# Patient Record
Sex: Female | Born: 2000 | Race: Black or African American | Hispanic: No | Marital: Single | Smoking: Never smoker
Health system: Southern US, Community
[De-identification: ages and names within clinical notes are randomized; demographics above are authoritative.]

---
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
## 2020-02-03 ENCOUNTER — Ambulatory Visit: Payer: 59 | Admitting: Internal Medicine

## 2020-08-28 ENCOUNTER — Other Ambulatory Visit: Payer: Self-pay

## 2020-08-28 ENCOUNTER — Encounter (HOSPITAL_COMMUNITY): Payer: Self-pay | Admitting: Emergency Medicine

## 2020-08-28 ENCOUNTER — Emergency Department (HOSPITAL_COMMUNITY): Payer: Self-pay

## 2020-08-28 ENCOUNTER — Emergency Department (HOSPITAL_COMMUNITY)
Admission: EM | Admit: 2020-08-28 | Discharge: 2020-08-28 | Disposition: A | Discharge status: Home / Self Care | Payer: Self-pay | Attending: Emergency Medicine | Admitting: Emergency Medicine

## 2020-08-28 DIAGNOSIS — W228XXA Striking against or struck by other objects, initial encounter: Secondary | ICD-10-CM | POA: Insufficient documentation

## 2020-08-28 DIAGNOSIS — S93401A Sprain of unspecified ligament of right ankle, initial encounter: Secondary | ICD-10-CM | POA: Insufficient documentation

## 2020-08-28 MED ORDER — IBUPROFEN 800 MG PO TABS
800.0000 mg | ORAL_TABLET | Freq: Once | ORAL | Status: AC
  Administered 2020-08-28: 800 mg via ORAL
  Filled 2020-08-28: qty 1

## 2020-08-28 NOTE — Discharge Instructions (Addendum)
We recommend bracing for stability as well as the use of ibuprofen every 6 hours for pain control.  Ice your ankle 3-4 times per day for 15 to 20 minutes each time to help lessen inflammation.  Follow-up with a primary care doctor if symptoms persist.
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
